# Patient Record
Sex: Male | Born: 1970 | Race: White | Hispanic: No | Marital: Single | Smoking: Never smoker
Health system: Southern US, Community
[De-identification: ages and names within clinical notes are randomized; demographics above are authoritative.]

## PROBLEM LIST (undated history)

## (undated) DIAGNOSIS — E119 Type 2 diabetes mellitus without complications: Secondary | ICD-10-CM

---

## 2015-08-11 ENCOUNTER — Encounter: Payer: Self-pay | Admitting: Emergency Medicine

## 2015-08-11 ENCOUNTER — Emergency Department: Payer: BLUE CROSS/BLUE SHIELD

## 2015-08-11 ENCOUNTER — Emergency Department
Admission: EM | Admit: 2015-08-11 | Discharge: 2015-08-11 | Disposition: A | Discharge status: Home / Self Care | Payer: BLUE CROSS/BLUE SHIELD | Attending: Emergency Medicine | Admitting: Emergency Medicine

## 2015-08-11 DIAGNOSIS — Z7984 Long term (current) use of oral hypoglycemic drugs: Secondary | ICD-10-CM | POA: Diagnosis not present

## 2015-08-11 DIAGNOSIS — E119 Type 2 diabetes mellitus without complications: Secondary | ICD-10-CM | POA: Diagnosis not present

## 2015-08-11 DIAGNOSIS — N200 Calculus of kidney: Secondary | ICD-10-CM | POA: Diagnosis not present

## 2015-08-11 DIAGNOSIS — R109 Unspecified abdominal pain: Secondary | ICD-10-CM

## 2015-08-11 HISTORY — DX: Type 2 diabetes mellitus without complications: E11.9

## 2015-08-11 LAB — URINALYSIS COMPLETE WITH MICROSCOPIC (ARMC ONLY)
BACTERIA UA: NONE SEEN
BILIRUBIN URINE: NEGATIVE
Hgb urine dipstick: NEGATIVE
KETONES UR: NEGATIVE mg/dL
Leukocytes, UA: NEGATIVE
Nitrite: NEGATIVE
Protein, ur: NEGATIVE mg/dL
SQUAMOUS EPITHELIAL / LPF: NONE SEEN
Specific Gravity, Urine: 1.012 (ref 1.005–1.030)
pH: 5 (ref 5.0–8.0)

## 2015-08-11 LAB — BASIC METABOLIC PANEL
Anion gap: 10 (ref 5–15)
BUN: 17 mg/dL (ref 6–20)
CHLORIDE: 99 mmol/L — AB (ref 101–111)
CO2: 31 mmol/L (ref 22–32)
Calcium: 9.9 mg/dL (ref 8.9–10.3)
Creatinine, Ser: 1.27 mg/dL — ABNORMAL HIGH (ref 0.61–1.24)
GFR calc Af Amer: >60 mL/min (ref >60)
GFR calc non Af Amer: >60 mL/min (ref >60)
GLUCOSE: 267 mg/dL — AB (ref 65–99)
POTASSIUM: 4.3 mmol/L (ref 3.5–5.1)
SODIUM: 140 mmol/L (ref 135–145)

## 2015-08-11 LAB — CBC
HEMATOCRIT: 43.7 % (ref 40.0–52.0)
HEMOGLOBIN: 15.1 g/dL (ref 13.0–18.0)
MCH: 30.5 pg (ref 26.0–34.0)
MCHC: 34.6 g/dL (ref 32.0–36.0)
MCV: 88.3 fL (ref 80.0–100.0)
Platelets: 253 10*3/uL (ref 150–440)
RBC: 4.95 MIL/uL (ref 4.40–5.90)
RDW: 12.5 % (ref 11.5–14.5)
WBC: 13.7 10*3/uL — ABNORMAL HIGH (ref 3.8–10.6)

## 2015-08-11 MED ORDER — OXYCODONE-ACETAMINOPHEN 5-325 MG PO TABS: 1.0000 | ORAL_TABLET | Freq: Four times a day (QID) | ORAL | Status: AC | PRN

## 2015-08-11 MED ORDER — TAMSULOSIN HCL 0.4 MG PO CAPS: 0.4000 mg | ORAL_CAPSULE | Freq: Every day | ORAL | Status: AC

## 2015-08-11 MED ORDER — ONDANSETRON HCL 4 MG/2ML IJ SOLN
4.0000 mg | Freq: Once | INTRAMUSCULAR | Status: AC
  Administered 2015-08-11: 4 mg via INTRAVENOUS
  Filled 2015-08-11: qty 2

## 2015-08-11 MED ORDER — MORPHINE SULFATE (PF) 4 MG/ML IV SOLN
4.0000 mg | Freq: Once | INTRAVENOUS | Status: AC
  Administered 2015-08-11: 4 mg via INTRAVENOUS
  Filled 2015-08-11: qty 1

## 2015-08-11 MED ORDER — SODIUM CHLORIDE 0.9 % IV BOLUS (SEPSIS)
1000.0000 mL | Freq: Once | INTRAVENOUS | Status: AC
  Administered 2015-08-11: 1000 mL via INTRAVENOUS

## 2015-08-11 MED ORDER — ONDANSETRON 4 MG PO TBDP: 4.0000 mg | ORAL_TABLET | Freq: Three times a day (TID) | ORAL | Status: AC | PRN

## 2015-08-11 MED ORDER — KETOROLAC TROMETHAMINE 30 MG/ML IJ SOLN
30.0000 mg | Freq: Once | INTRAMUSCULAR | Status: AC
  Administered 2015-08-11: 30 mg via INTRAVENOUS
  Filled 2015-08-11: qty 1

## 2015-08-11 MED ORDER — OXYCODONE-ACETAMINOPHEN 5-325 MG PO TABS
1.0000 | ORAL_TABLET | Freq: Once | ORAL | Status: AC
  Administered 2015-08-11: 1 via ORAL
  Filled 2015-08-11: qty 1

## 2015-08-11 NOTE — ED Notes (Addendum)
Pt ambulatory to room. Pt states around 10p his testicles "started to irritate me, then it moved to my abdomen, and now my lower back hurts. I've thrown up a few times." Pt states he was watching a football game when the pain began, states he did not move in a weird way or get hit by anything. Pt has hx of kidney stones, states "I've passed a couple small ones, nothing I've ever had to go to a doctor before."

## 2015-08-11 NOTE — ED Notes (Signed)
Pt waiting on Ct scan disk.

## 2015-08-11 NOTE — ED Notes (Signed)
Patient ambulatory to triage with steady gait, without difficulty or distress noted; pt reports pain in testicles and lower abd/back PTA accomp by nausea; denies hx of same

## 2015-08-11 NOTE — ED Provider Notes (Signed)
[facility] Emergency Department Provider Note  ____________________________________________  Time seen: Approximately 153 AM  I have reviewed the triage vital signs and the nursing notes.   HISTORY  Chief Complaint Abdominal Pain    HPI Ralph Young is a 44 y.o. male who comes into the hospital today with some left flank pain. The patient reports that a few hours ago he developed some soreness in his testicles that moved into his lower abdomen and then into his lower back. The patient has had some nausea as well as a few episodes of vomiting. The patient thinks that he is dehydrated. He rates his pain a 10 out of 10 in intensity. He has not taken anything for pain. The patient is currently staying with friends and he was unsure what else to do things that come in to get evaluated. The patient has had this pain before that has not had blood in his urine or pain when he urinates. The patient has had kidney stones in the past but reports that it's only been a few small kidney stones.The patient is here for evaluation.   Past Medical History  Diagnosis Date  . Diabetes mellitus without complication (HCC)     There are no active problems to display for this patient.   History reviewed. No pertinent past surgical history.  Current Outpatient Rx  Name  Route  Sig  Dispense  Refill  . metFORMIN (GLUCOPHAGE-XR) 500 MG 24 hr tablet   Oral   Take 1,000 tablets by mouth 2 (two) times daily.          . ondansetron (ZOFRAN ODT) 4 MG disintegrating tablet   Oral   Take 1 tablet (4 mg total) by mouth every 8 (eight) hours as needed for nausea or vomiting.   20 tablet   0   . oxyCODONE-acetaminophen (ROXICET) 5-325 MG tablet   Oral   Take 1 tablet by mouth every 6 (six) hours as needed.   12 tablet   0   . tamsulosin (FLOMAX) 0.4 MG CAPS capsule   Oral   Take 1 capsule (0.4 mg total) by mouth daily.   7 capsule   0     Allergies Review of patient's  allergies indicates no known allergies.  No family history on file.  Social History Social History  Substance Use Topics  . Smoking status: Never Smoker   . Smokeless tobacco: None  . Alcohol Use: No    Review of Systems Constitutional: No fever/chills Eyes: No visual changes. ENT: No sore throat. Cardiovascular: Denies chest pain. Respiratory: Denies shortness of breath. Gastrointestinal: Left abdominal pain, nausea, vomiting Genitourinary: Negative for dysuria. Musculoskeletal: Left Flank pain Skin: Negative for rash. Neurological: Negative for headaches, focal weakness or numbness.  10-point ROS otherwise negative.  ____________________________________________   PHYSICAL EXAM:  VITAL SIGNS: ED Triage Vitals  Enc Vitals Group     BP 08/11/15 0115 158/92 mmHg     Pulse Rate 08/11/15 0115 79     Resp 08/11/15 0115 20     Temp 08/11/15 0115 98.1 F (36.7 C)     Temp Source 08/11/15 0115 Oral     SpO2 08/11/15 0115 99 %     Weight 08/11/15 0115 215 lb (97.523 kg)     Height 08/11/15 0115 6\' 2"  (1.88 m)     Head Cir --      Peak Flow --      Pain Score 08/11/15 0114 10     Pain  Loc --      Pain Edu? --      Excl. in GC? --     Constitutional: Alert and oriented. Well appearing and in moderate distress. Eyes: Conjunctivae are normal. PERRL. EOMI. Head: Atraumatic. Nose: No congestion/rhinnorhea. Mouth/Throat: Mucous membranes are moist.  Oropharynx non-erythematous. Cardiovascular: Normal rate, regular rhythm. Grossly normal heart sounds.  Good peripheral circulation. Respiratory: Normal respiratory effort.  No retractions. Lungs CTAB. Gastrointestinal: Soft and nontender. No distention. Positive bowel sounds, patient denies CVA tenderness to palpation Genitourinary: Testicles without pain or swelling no hernias palpated Musculoskeletal: No lower extremity tenderness nor edema.   Neurologic:  Normal speech and language. No gross focal neurologic deficits are  appreciated. No gait instability. Skin:  Skin is warm, dry and intact.  Psychiatric: Mood and affect are normal.   ____________________________________________   LABS (all labs ordered are listed, but only abnormal results are displayed)  Labs Reviewed  URINALYSIS COMPLETEWITH MICROSCOPIC ([facility] ONLY) - Abnormal; Notable for the following:    Color, Urine YELLOW (*)    APPearance CLEAR (*)    Glucose, UA >500 (*)    All other components within normal limits  CBC - Abnormal; Notable for the following:    WBC 13.7 (*)    All other components within normal limits  BASIC METABOLIC PANEL - Abnormal; Notable for the following:    Chloride 99 (*)    Glucose, Bld 267 (*)    Creatinine, Ser 1.27 (*)    All other components within normal limits   ____________________________________________  EKG  None ____________________________________________  RADIOLOGY  CT renal stone study: Mild left-sided hydronephrosis with large obstructing 1.2 x 0.9 cm stone approximately at the left ureteropelvic junction, additional nonobstructing bilateral renal stones measuring up to 9 mm in size ____________________________________________   PROCEDURES  Procedure(s) performed: None  Critical Care performed: No  ____________________________________________   INITIAL IMPRESSION / ASSESSMENT AND PLAN / ED COURSE  Pertinent labs & imaging results that were available during my care of the patient were reviewed by me and considered in my medical decision making (see chart for details).  This is a 44 year old male who comes into the hospital with testicle pain abdomen pain and left flank pain. I did initially give the patient a dose of Toradol as well as morphine and Zofran. I gave the patient liter of normal saline with the concern of a kidney stone. The patient's urine did not show any blood but I did send him for CT scan which showed a kidney stone.  After the fluid, morphine and Toradol the  patient reports that he was feeling improved. I informed him that he probably needs to have a lithotripsy done by urology. I will send the patient home with some pain medicine as well as some Flomax. The patient reports that he will try to follow-up with a urologist closer to his home. I will give him information for [facility] urologic services and have him follow-up with his doctor or [facility] urologic services. ____________________________________________   FINAL CLINICAL IMPRESSION(S) / ED DIAGNOSES  Final diagnoses:  Left flank pain  Kidney stone on left side      Rebecka Apley, MD 08/11/15 [phone]

## 2015-08-11 NOTE — Discharge Instructions (Signed)
Kidney Stones °Kidney stones (urolithiasis) are deposits that form inside your kidneys. The intense pain is caused by the stone moving through the urinary tract. When the stone moves, the ureter goes into spasm around the stone. The stone is usually passed in the urine.  °CAUSES  °· A disorder that makes certain neck glands produce too much parathyroid hormone (primary hyperparathyroidism). °· A buildup of uric acid crystals, similar to gout in your joints. °· Narrowing (stricture) of the ureter. °· A kidney obstruction present at birth (congenital obstruction). °· Previous surgery on the kidney or ureters. °· Numerous kidney infections. °SYMPTOMS  °· Feeling sick to your stomach (nauseous). °· Throwing up (vomiting). °· Blood in the urine (hematuria). °· Pain that usually spreads (radiates) to the groin. °· Frequency or urgency of urination. °DIAGNOSIS  °· Taking a history and physical exam. °· Blood or urine tests. °· CT scan. °· Occasionally, an examination of the inside of the urinary bladder (cystoscopy) is performed. °TREATMENT  °· Observation. °· Increasing your fluid intake. °· Extracorporeal shock wave lithotripsy--This is a noninvasive procedure that uses shock waves to break up kidney stones. °· Surgery may be needed if you have severe pain or persistent obstruction. There are various surgical procedures. Most of the procedures are performed with the use of small instruments. Only small incisions are needed to accommodate these instruments, so recovery time is minimized. °The size, location, and chemical composition are all important variables that will determine the proper choice of action for you. Talk to your health care provider to better understand your situation so that you will minimize the risk of injury to yourself and your kidney.  °HOME CARE INSTRUCTIONS  °· Drink enough water and fluids to keep your urine clear or pale yellow. This will help you to pass the stone or stone fragments. °· Strain  all urine through the provided strainer. Keep all particulate matter and stones for your health care provider to see. The stone causing the pain may be as small as a grain of salt. It is very important to use the strainer each and every time you pass your urine. The collection of your stone will allow your health care provider to analyze it and verify that a stone has actually passed. The stone analysis will often identify what you can do to reduce the incidence of recurrences. °· Only take over-the-counter or prescription medicines for pain, discomfort, or fever as directed by your health care provider. °· Keep all follow-up visits as told by your health care provider. This is important. °· Get follow-up X-rays if required. The absence of pain does not always mean that the stone has passed. It may have only stopped moving. If the urine remains completely obstructed, it can cause loss of kidney function or even complete destruction of the kidney. It is your responsibility to make sure X-rays and follow-ups are completed. Ultrasounds of the kidney can show blockages and the status of the kidney. Ultrasounds are not associated with any radiation and can be performed easily in a matter of minutes. °· Make changes to your daily diet as told by your health care provider. You may be told to: °¨ Limit the amount of salt that you eat. °¨ Eat 5 or more servings of fruits and vegetables each day. °¨ Limit the amount of meat, poultry, fish, and eggs that you eat. °· Collect a 24-hour urine sample as told by your health care provider. You may need to collect another urine sample every 6-12   months. °SEEK MEDICAL CARE IF: °· You experience pain that is progressive and unresponsive to any pain medicine you have been prescribed. °SEEK IMMEDIATE MEDICAL CARE IF:  °· Pain cannot be controlled with the prescribed medicine. °· You have a fever or shaking chills. °· The severity or intensity of pain increases over 18 hours and is not  relieved by pain medicine. °· You develop a new onset of abdominal pain. °· You feel faint or pass out. °· You are unable to urinate. °  °This information is not intended to replace advice given to you by your health care provider. Make sure you discuss any questions you have with your health care provider. °  °Document Released: 08/01/2005 Document Revised: 04/22/2015 Document Reviewed: 01/02/2013 °Elsevier Interactive Patient Education ©2016 [employer] ° °

## 2016-11-22 IMAGING — CT CT RENAL STONE PROTOCOL
1 of 3 series · 15 of 32 positions shown, 19 images · non-contrast
Comparison: None.

CLINICAL DATA: Acute onset of testicular discomfort, lower
abdominal pain and left lower back pain. Vomiting. Initial
encounter.

EXAM:
CT ABDOMEN AND PELVIS WITHOUT CONTRAST
TECHNIQUE: Multidetector CT imaging of the abdomen and pelvis was performed
following the standard protocol without IV contrast.

[Series 2: stone standard full · axial · 0.77mm/px · z∈[-616,-116]mm · 15 of 111 slices shown, 19 images]
[im 6/111  soft-tissue]
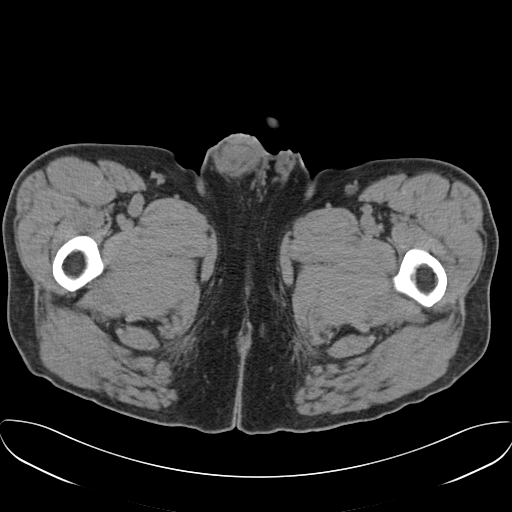
[im 6/111  bone]
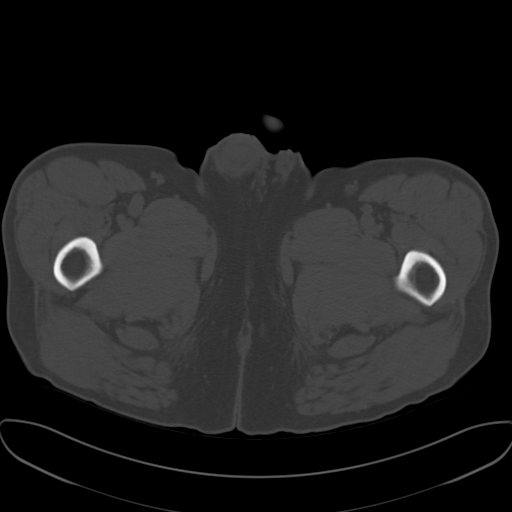
[im 16/111  soft-tissue]
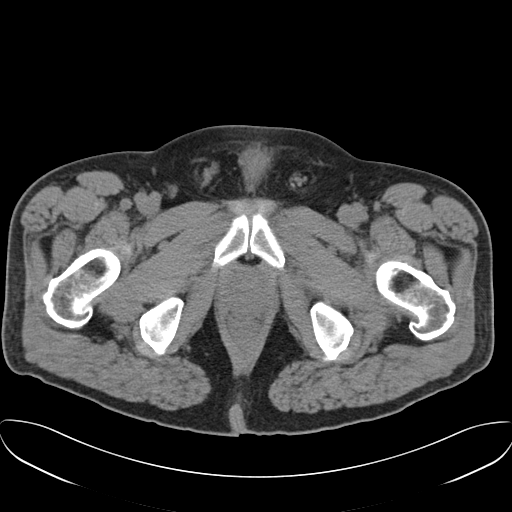
[im 26/111  soft-tissue]
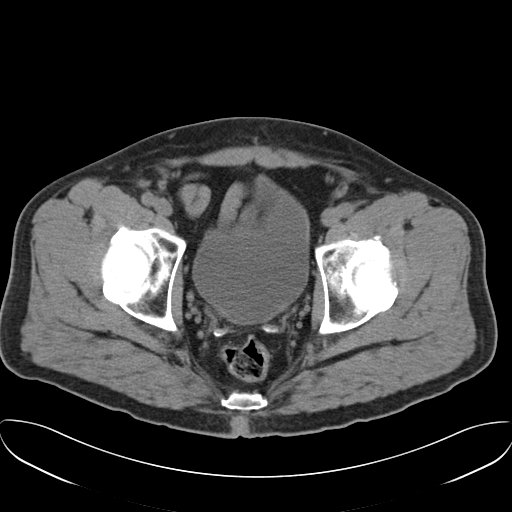
[im 31/111  soft-tissue]
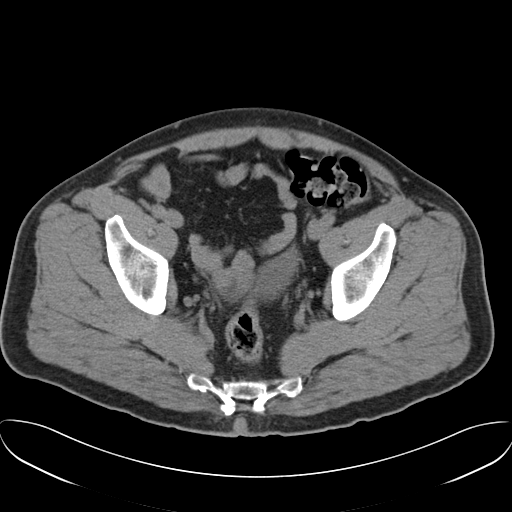
[im 41/111  soft-tissue]
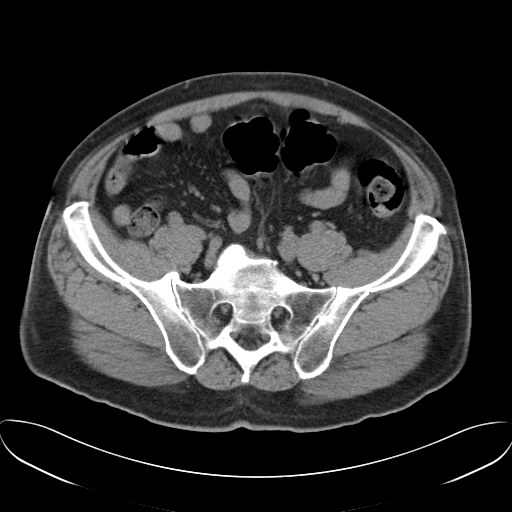
[im 46/111  soft-tissue]
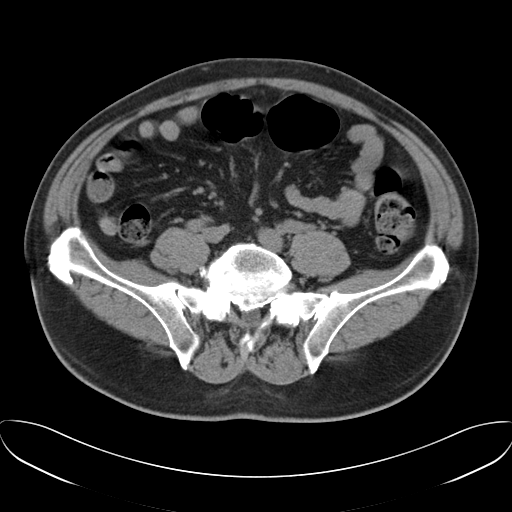
[im 56/111  soft-tissue]
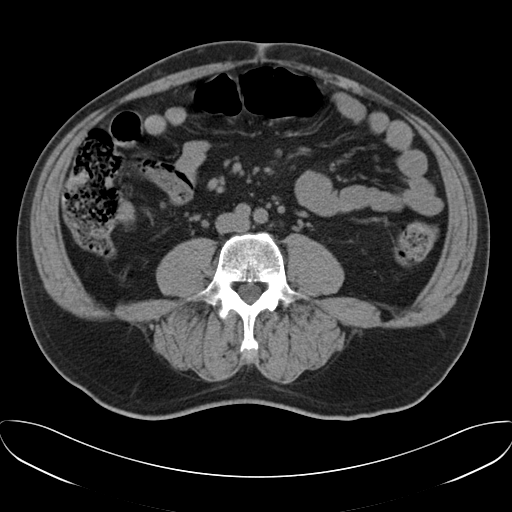
[im 66/111  soft-tissue]
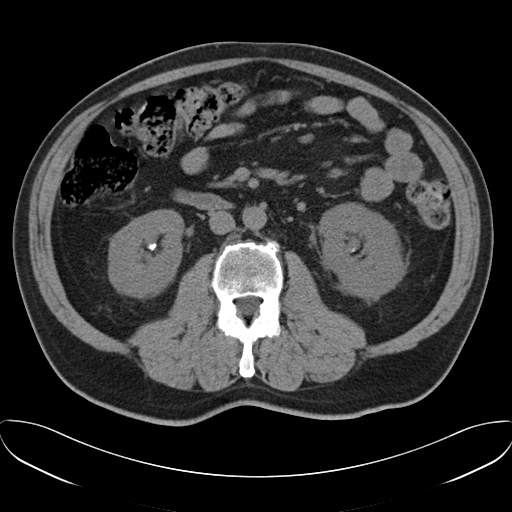
[im 71/111  soft-tissue]
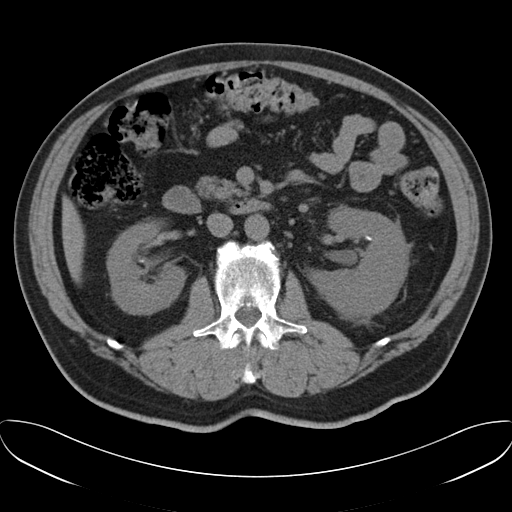
[im 71/111  bone]
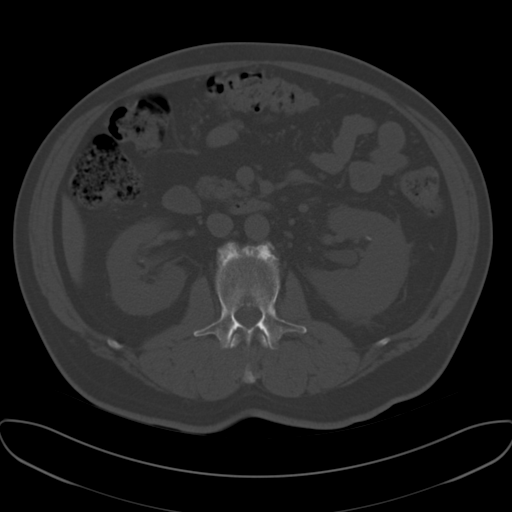
[im 81/111  soft-tissue]
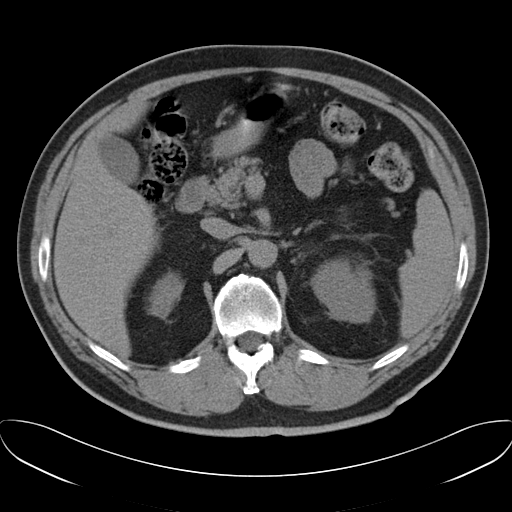
[im 86/111  soft-tissue]
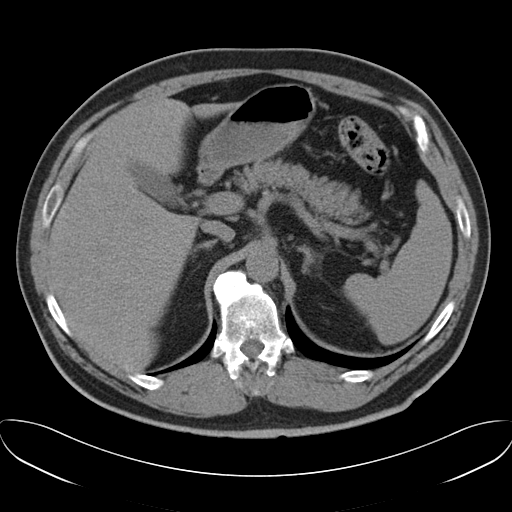
[im 91/111  lung]
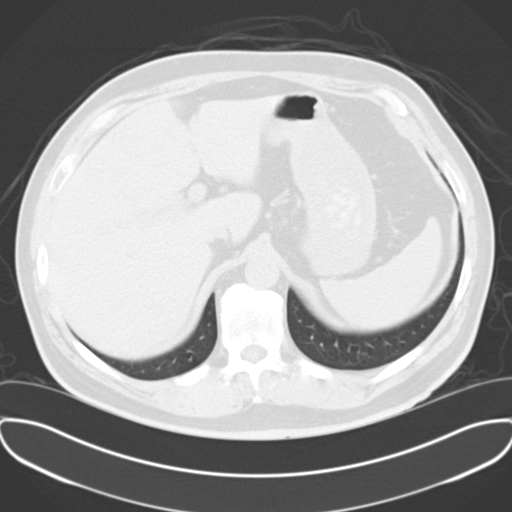
[im 96/111  soft-tissue]
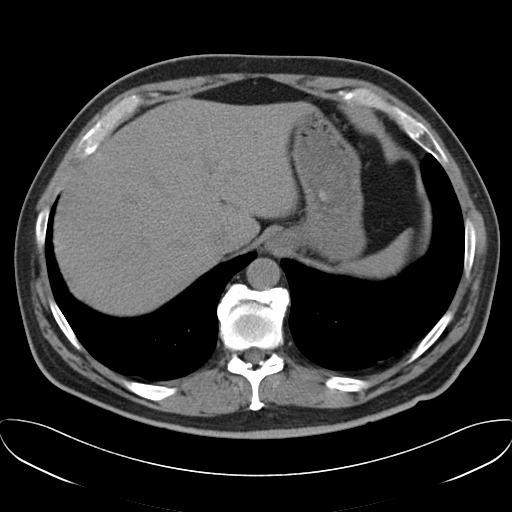
[im 96/111  lung]
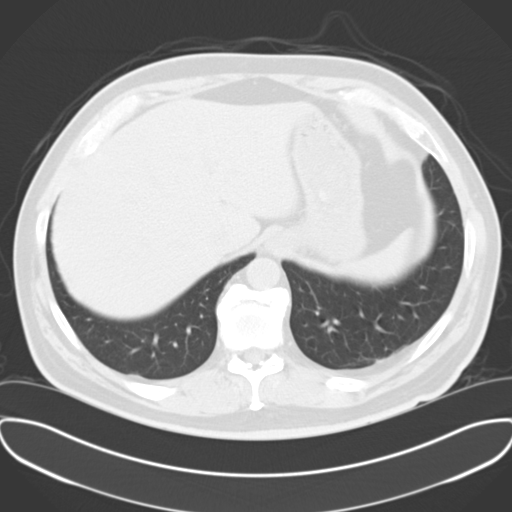
[im 101/111  lung]
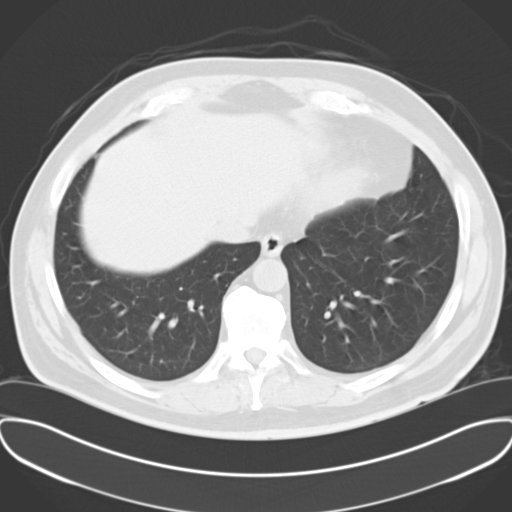
[im 106/111  soft-tissue]
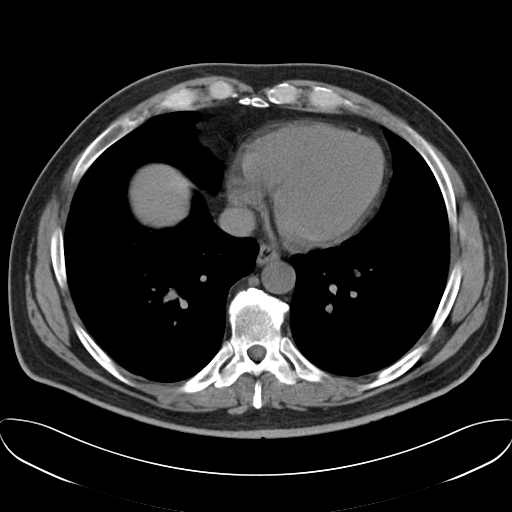
[im 106/111  lung]
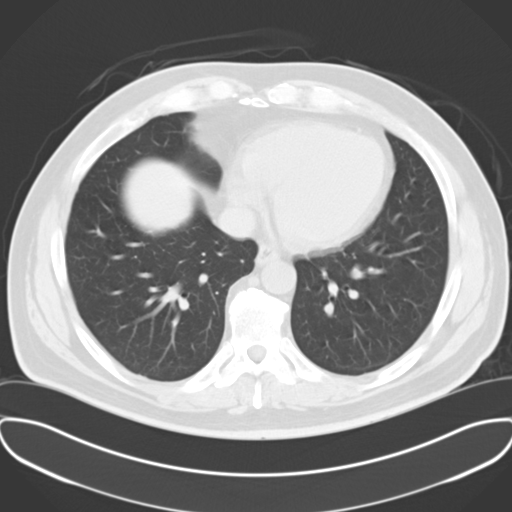

[15 of 32 positions shown; findings below may reference images not displayed]

FINDINGS: The visualized lung bases are clear.

The liver and spleen are unremarkable in appearance. The gallbladder
is within normal limits. The pancreas and adrenal glands are
unremarkable.

There is mild left-sided hydronephrosis, with a large obstructing
1.2 x 0.9 cm stone noted proximally at the left ureteropelvic
junction. Mildly asymmetric left-sided perinephric stranding and
fluid are seen.

Additional nonobstructing bilateral renal stones are identified,
measuring up to 9 mm in size. Mild right-sided renal
pelvicaliectasis remains within normal limits, as no distal
obstructing stone is seen on the right side.

No free fluid is identified. The small bowel is unremarkable in
appearance. The stomach is within normal limits. No acute vascular
abnormalities are seen. Minimal calcification is noted at the distal
abdominal aorta.

The appendix is normal in caliber and contains air, without evidence
of appendicitis. The colon is grossly unremarkable in appearance.

The bladder is mildly distended and grossly unremarkable. The
prostate remains normal in size, with scattered calcification. No
inguinal lymphadenopathy is seen.

No acute osseous abnormalities are identified. Prominent anterior
osteophytes are noted along the lower thoracic spine, and a
calcified posterior disc protrusion is seen at L5-S1, with mild
bilateral foraminal narrowing.
IMPRESSION: 1. Mild left-sided hydronephrosis, with a large obstructing 1.2 x
0.9 cm stone proximally at the left ureteropelvic junction.
2. Additional nonobstructing bilateral renal stones measure up to 9
mm in size.
